# Patient Record
Sex: Male | Born: 1965 | Race: White | Hispanic: No | Marital: Married | Smoking: Former smoker
Health system: Southern US, Community
[De-identification: ages and names within clinical notes are randomized; demographics above are authoritative.]

---

## 2008-06-29 ENCOUNTER — Ambulatory Visit: Payer: Self-pay | Admitting: Occupational Medicine

## 2008-06-29 DIAGNOSIS — F329 Major depressive disorder, single episode, unspecified: Secondary | ICD-10-CM

## 2008-06-29 DIAGNOSIS — F3289 Other specified depressive episodes: Secondary | ICD-10-CM | POA: Insufficient documentation

## 2008-06-29 DIAGNOSIS — F411 Generalized anxiety disorder: Secondary | ICD-10-CM | POA: Insufficient documentation

## 2008-07-27 ENCOUNTER — Ambulatory Visit: Payer: Self-pay | Admitting: Occupational Medicine

## 2008-08-01 ENCOUNTER — Ambulatory Visit: Payer: Self-pay | Admitting: Family Medicine

## 2008-08-01 DIAGNOSIS — M76899 Other specified enthesopathies of unspecified lower limb, excluding foot: Secondary | ICD-10-CM | POA: Insufficient documentation

## 2008-08-01 DIAGNOSIS — S339XXA Sprain of unspecified parts of lumbar spine and pelvis, initial encounter: Secondary | ICD-10-CM | POA: Insufficient documentation

## 2008-08-01 DIAGNOSIS — S335XXA Sprain of ligaments of lumbar spine, initial encounter: Secondary | ICD-10-CM

## 2008-09-07 ENCOUNTER — Ambulatory Visit: Payer: Self-pay | Admitting: Family Medicine

## 2008-09-07 DIAGNOSIS — M545 Low back pain, unspecified: Secondary | ICD-10-CM | POA: Insufficient documentation

## 2008-10-02 ENCOUNTER — Encounter: Payer: Self-pay | Admitting: Family Medicine

## 2008-10-05 ENCOUNTER — Ambulatory Visit: Payer: Self-pay | Admitting: Family Medicine

## 2008-10-05 DIAGNOSIS — G510 Bell's palsy: Secondary | ICD-10-CM | POA: Insufficient documentation

## 2008-10-05 DIAGNOSIS — I10 Essential (primary) hypertension: Secondary | ICD-10-CM | POA: Insufficient documentation

## 2008-10-25 ENCOUNTER — Telehealth: Payer: Self-pay | Admitting: Family Medicine

## 2008-10-28 ENCOUNTER — Telehealth: Payer: Self-pay | Admitting: Family Medicine

## 2008-11-08 ENCOUNTER — Encounter: Payer: Self-pay | Admitting: Family Medicine

## 2008-11-08 ENCOUNTER — Ambulatory Visit: Payer: Self-pay | Admitting: Family Medicine

## 2008-11-09 ENCOUNTER — Encounter: Payer: Self-pay | Admitting: Family Medicine

## 2008-11-09 LAB — CONVERTED CEMR LAB
AST: 9 units/L (ref 0–37)
Alkaline Phosphatase: 62 units/L (ref 39–117)
CO2: 24 meq/L (ref 19–32)
Chloride: 105 meq/L (ref 96–112)
Creatinine, Ser: 0.75 mg/dL (ref 0.40–1.50)
Glucose, Bld: 160 mg/dL — ABNORMAL HIGH (ref 70–99)
Potassium: 4.6 meq/L (ref 3.5–5.3)
T3, Free: 3 pg/mL (ref 2.3–4.2)
TSH: 0.308 microintl units/mL — ABNORMAL LOW (ref 0.350–4.500)
Total Bilirubin: 0.7 mg/dL (ref 0.3–1.2)

## 2008-11-17 ENCOUNTER — Telehealth (INDEPENDENT_AMBULATORY_CARE_PROVIDER_SITE_OTHER): Payer: Self-pay | Admitting: *Deleted

## 2008-11-18 ENCOUNTER — Telehealth (INDEPENDENT_AMBULATORY_CARE_PROVIDER_SITE_OTHER): Payer: Self-pay | Admitting: *Deleted

## 2008-11-22 ENCOUNTER — Encounter: Payer: Self-pay | Admitting: Family Medicine

## 2008-11-23 ENCOUNTER — Telehealth (INDEPENDENT_AMBULATORY_CARE_PROVIDER_SITE_OTHER): Payer: Self-pay | Admitting: *Deleted

## 2008-11-23 DIAGNOSIS — R946 Abnormal results of thyroid function studies: Secondary | ICD-10-CM | POA: Insufficient documentation

## 2008-11-26 ENCOUNTER — Encounter: Payer: Self-pay | Admitting: Family Medicine

## 2008-11-30 ENCOUNTER — Encounter: Payer: Self-pay | Admitting: Family Medicine

## 2008-12-22 ENCOUNTER — Encounter: Payer: Self-pay | Admitting: Family Medicine

## 2008-12-28 ENCOUNTER — Encounter: Payer: Self-pay | Admitting: Family Medicine

## 2009-01-14 ENCOUNTER — Telehealth: Payer: Self-pay | Admitting: Family Medicine

## 2009-01-20 ENCOUNTER — Telehealth: Payer: Self-pay | Admitting: Family Medicine

## 2009-02-09 ENCOUNTER — Encounter: Payer: Self-pay | Admitting: Family Medicine

## 2009-02-11 ENCOUNTER — Encounter: Payer: Self-pay | Admitting: Family Medicine

## 2009-02-12 ENCOUNTER — Encounter: Payer: Self-pay | Admitting: Family Medicine

## 2009-02-16 ENCOUNTER — Encounter: Payer: Self-pay | Admitting: Family Medicine

## 2009-02-17 ENCOUNTER — Ambulatory Visit: Payer: Self-pay | Admitting: Family Medicine

## 2009-02-17 DIAGNOSIS — J019 Acute sinusitis, unspecified: Secondary | ICD-10-CM | POA: Insufficient documentation

## 2009-02-23 ENCOUNTER — Ambulatory Visit: Payer: Self-pay | Admitting: Family Medicine

## 2009-02-23 ENCOUNTER — Encounter: Admission: RE | Admit: 2009-02-23 | Discharge: 2009-02-23 | Payer: Self-pay | Admitting: Family Medicine

## 2009-02-23 DIAGNOSIS — R05 Cough: Secondary | ICD-10-CM

## 2009-02-23 DIAGNOSIS — R059 Cough, unspecified: Secondary | ICD-10-CM | POA: Insufficient documentation

## 2009-03-16 ENCOUNTER — Ambulatory Visit: Payer: Self-pay | Admitting: Family Medicine

## 2009-03-23 ENCOUNTER — Ambulatory Visit: Payer: Self-pay | Admitting: Family Medicine

## 2009-04-04 ENCOUNTER — Ambulatory Visit: Payer: Self-pay | Admitting: Family Medicine

## 2009-04-04 DIAGNOSIS — R51 Headache: Secondary | ICD-10-CM | POA: Insufficient documentation

## 2009-04-04 DIAGNOSIS — R519 Headache, unspecified: Secondary | ICD-10-CM | POA: Insufficient documentation

## 2009-04-05 ENCOUNTER — Encounter: Payer: Self-pay | Admitting: Family Medicine

## 2009-04-05 LAB — CONVERTED CEMR LAB
AST: 10 units/L (ref 0–37)
Alkaline Phosphatase: 61 units/L (ref 39–117)
Calcium: 10 mg/dL (ref 8.4–10.5)
HCT: 44.8 % (ref 39.0–52.0)
MCHC: 33.3 g/dL (ref 30.0–36.0)
MCV: 97.4 fL (ref 78.0–100.0)
Platelets: 333 10*3/uL (ref 150–400)
RDW: 15.1 % (ref 11.5–15.5)
Total Protein: 6.9 g/dL (ref 6.0–8.3)
Triglycerides: 427 mg/dL — ABNORMAL HIGH (ref <150)

## 2009-04-18 ENCOUNTER — Encounter: Payer: Self-pay | Admitting: Family Medicine

## 2009-04-19 ENCOUNTER — Ambulatory Visit: Payer: Self-pay | Admitting: Family Medicine

## 2009-04-19 DIAGNOSIS — E785 Hyperlipidemia, unspecified: Secondary | ICD-10-CM | POA: Insufficient documentation

## 2009-04-20 ENCOUNTER — Encounter: Payer: Self-pay | Admitting: Family Medicine

## 2009-04-20 LAB — CONVERTED CEMR LAB
AST: 10 units/L (ref 0–37)
CO2: 26 meq/L (ref 19–32)
Chloride: 102 meq/L (ref 96–112)
Glucose, Bld: 111 mg/dL — ABNORMAL HIGH (ref 70–99)
Potassium: 4.4 meq/L (ref 3.5–5.3)
Total Bilirubin: 0.5 mg/dL (ref 0.3–1.2)
Total Protein: 6.8 g/dL (ref 6.0–8.3)

## 2009-05-05 ENCOUNTER — Ambulatory Visit: Payer: Self-pay | Admitting: Family Medicine

## 2009-05-05 DIAGNOSIS — E119 Type 2 diabetes mellitus without complications: Secondary | ICD-10-CM | POA: Insufficient documentation

## 2009-05-05 LAB — CONVERTED CEMR LAB: Microalbumin U total vol: 150 mg/L

## 2009-05-11 ENCOUNTER — Telehealth (INDEPENDENT_AMBULATORY_CARE_PROVIDER_SITE_OTHER): Payer: Self-pay | Admitting: *Deleted

## 2009-05-15 ENCOUNTER — Telehealth: Payer: Self-pay | Admitting: Family Medicine

## 2009-05-17 ENCOUNTER — Ambulatory Visit: Payer: Self-pay | Admitting: Family Medicine

## 2009-05-17 ENCOUNTER — Telehealth: Payer: Self-pay | Admitting: Family Medicine

## 2009-05-17 DIAGNOSIS — M542 Cervicalgia: Secondary | ICD-10-CM | POA: Insufficient documentation

## 2009-05-18 ENCOUNTER — Encounter: Payer: Self-pay | Admitting: Family Medicine

## 2009-05-20 ENCOUNTER — Ambulatory Visit: Payer: Self-pay | Admitting: Family Medicine

## 2009-05-26 ENCOUNTER — Encounter: Payer: Self-pay | Admitting: Family Medicine

## 2009-06-06 ENCOUNTER — Ambulatory Visit: Payer: Self-pay | Admitting: Family Medicine

## 2009-06-13 ENCOUNTER — Telehealth: Payer: Self-pay | Admitting: Family Medicine

## 2009-06-22 ENCOUNTER — Ambulatory Visit: Payer: Self-pay | Admitting: Family Medicine

## 2009-06-24 ENCOUNTER — Encounter: Payer: Self-pay | Admitting: Family Medicine

## 2009-06-29 ENCOUNTER — Encounter: Payer: Self-pay | Admitting: Family Medicine

## 2009-07-12 ENCOUNTER — Telehealth: Payer: Self-pay | Admitting: Family Medicine

## 2009-08-10 ENCOUNTER — Telehealth: Payer: Self-pay | Admitting: Family Medicine

## 2009-08-31 ENCOUNTER — Telehealth: Payer: Self-pay | Admitting: Family Medicine

## 2009-09-06 ENCOUNTER — Emergency Department (HOSPITAL_COMMUNITY): Admission: EM | Admit: 2009-09-06 | Discharge: 2009-09-06 | Payer: Self-pay | Admitting: Emergency Medicine

## 2009-09-29 ENCOUNTER — Ambulatory Visit: Payer: Self-pay | Admitting: Family Medicine

## 2009-12-02 ENCOUNTER — Encounter: Payer: Self-pay | Admitting: Family Medicine

## 2009-12-05 ENCOUNTER — Encounter: Payer: Self-pay | Admitting: Family Medicine

## 2009-12-09 ENCOUNTER — Telehealth (INDEPENDENT_AMBULATORY_CARE_PROVIDER_SITE_OTHER): Payer: Self-pay | Admitting: *Deleted

## 2010-08-28 ENCOUNTER — Encounter: Payer: Self-pay | Admitting: Family Medicine

## 2010-09-05 NOTE — Progress Notes (Signed)
Summary: Alprazolam refills  Phone Note Refill Request Message from:  Pharmacy on Dec 09, 2009 2:08 PM  Refills Requested: Medication #1:  ALPRAZOLAM 1 MG TABS Take 1-2 tablet by mouth two times a day as needed anxiety Initial call taken by: Payton Spark CMA,  Dec 09, 2009 2:08 PM  Follow-up for Phone Call        Denies too soon. Current rx should last him at most 2 min 2 months.  Not due until 5/31 Follow-up by: Nani Gasser MD,  Dec 09, 2009 2:36 PM  Additional Follow-up for Phone Call Additional follow up Details #1::        [facility] informing Pt  Additional Follow-up by: Payton Spark CMA,  Dec 09, 2009 2:44 PM

## 2010-09-05 NOTE — Progress Notes (Signed)
Summary: Wants prednisone rx  Phone Note Call from Patient Call back at Home Phone [phone]   Caller: Patient Call For: Nani Gasser MD Summary of Call: Pt called and wants a refillon his prednisone 20mg  taking one tablet by mouth twice a day Initial call taken by: Kathlene November,  August 10, 2009 4:26 PM  Follow-up for Phone Call        Who rx this for him before? Neuro?  Follow-up by: Nani Gasser MD,  August 11, 2009 8:03 AM  Additional Follow-up for Phone Call Additional follow up Details #1::        He said Dr. Loleta Chance Additional Follow-up by: Kathlene November,  August 11, 2009 8:11 AM    Additional Follow-up for Phone Call Additional follow up Details #2::    Will refill x 1 but shuldl already be seeing new neuro. Have pharm send over dosing, instructions, and quantity. Follow-up by: Nani Gasser MD,  August 11, 2009 8:23 AM  Additional Follow-up for Phone Call Additional follow up Details #3:: Details for Additional Follow-up Action Taken: pt notified of above instructions. Additional Follow-up by: Kathlene November,  August 11, 2009 8:26 AM

## 2010-09-05 NOTE — Progress Notes (Signed)
Summary: need meds called in  Phone Note Call from Patient   Caller: Spouse Call For: [facility] Summary of Call: Dr.Jaelani Posa     Call Back  [phone]  Patients wife(Beverly) left a message on the machine and said Tymir need some meds called in--She didnt say what meds. Initial call taken by: Vanessa Swaziland,  August 31, 2009 8:08 AM  Follow-up for Phone Call        [facility] 1%. [facility]    Prescriptions: CLOTRIMAZOLE 1 % CREA (CLOTRIMAZOLE) Apply two times a day to affected area.  #1 lg tube x 1   Entered by:   Kathlene November   Authorized by:   Nani Gasser MD   Signed by:   Kathlene November on 08/31/2009   Method used:   [facility]  [address] [id number]       [address]       [place], [place]  [address]       Ph: [phone] or [phone]       Fax: [phone]   RxID:   [id number]
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
## 2010-09-05 NOTE — Consult Note (Signed)
Summary: [facility]  [facility]   Imported By: Lanelle Bal 12/12/2009 08:14:03  _____________________________________________________________________  External Attachment:    Type:   Image     Comment:   External Document

## 2010-09-05 NOTE — Letter (Signed)
Summary: Letter to Patient Regarding Lab Results/[facility]  Letter to Patient Regarding Lab Results/[facility]   Imported By: Lanelle Bal 12/12/2009 08:36:36  _____________________________________________________________________  External Attachment:    Type:   Image     Comment:   External Document

## 2010-09-05 NOTE — Progress Notes (Signed)
Summary: Chantix  Phone Note Call from Patient Call back at Home Phone [phone]   Caller: Patient Call For: Nani Gasser MD Summary of Call: Pt seen heart doctor and he reccomened he start Chantix to stop smoking. [facility] [address] Initial call taken by: Kathlene November,  August 31, 2009 8:24 AM  Follow-up for Phone Call        OK will call in but needs to fu in 3 weeks before next refill to see how he is doing.  Follow-up by: Nani Gasser MD,  August 31, 2009 8:50 AM    New/Updated Medications: CHANTIX STARTING MONTH PAK 0.5 MG X 11 & 1 MG X 42 TABS (VARENICLINE TARTRATE) Take as directed. Stop smoking after the first week of medicine. Prescriptions: CHANTIX STARTING MONTH PAK 0.5 MG X 11 & 1 MG X 42 TABS (VARENICLINE TARTRATE) Take as directed. Stop smoking after the first week of medicine.  #1 pack x 0   Entered by:   Kathlene November   Authorized by:   Nani Gasser MD   Signed by:   Kathlene November on 08/31/2009   Method used:   [facility]        [facility]  [address] [id number]       [address]       [place], [place]  [address]       Ph: [phone] or [phone]       Fax: [phone]   RxID:   [id number]

## 2010-10-26 LAB — URINALYSIS, ROUTINE W REFLEX MICROSCOPIC
Bilirubin Urine: NEGATIVE
Specific Gravity, Urine: 1.022 (ref 1.005–1.030)
Urobilinogen, UA: 0.2 mg/dL (ref 0.0–1.0)
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
## 2013-11-16 ENCOUNTER — Ambulatory Visit: Payer: Self-pay | Admitting: Physician Assistant

## 2013-11-16 DIAGNOSIS — Z0289 Encounter for other administrative examinations: Secondary | ICD-10-CM

## 2015-04-12 ENCOUNTER — Encounter (HOSPITAL_COMMUNITY): Payer: Self-pay | Admitting: Psychiatry

## 2015-04-12 ENCOUNTER — Ambulatory Visit (INDEPENDENT_AMBULATORY_CARE_PROVIDER_SITE_OTHER): Payer: 59 | Admitting: Psychiatry

## 2015-04-12 VITALS — BP 128/70 | HR 78 | Ht 72.0 in | Wt 225.0 lb

## 2015-04-12 DIAGNOSIS — F411 Generalized anxiety disorder: Secondary | ICD-10-CM

## 2015-04-12 DIAGNOSIS — F0630 Mood disorder due to known physiological condition, unspecified: Secondary | ICD-10-CM

## 2015-04-12 DIAGNOSIS — F331 Major depressive disorder, recurrent, moderate: Secondary | ICD-10-CM | POA: Diagnosis not present

## 2015-04-12 MED ORDER — TRAZODONE HCL 100 MG PO TABS: 100.0000 mg | ORAL_TABLET | Freq: Every evening | ORAL | Status: DC | PRN

## 2015-04-12 MED ORDER — BUSPIRONE HCL 7.5 MG PO TABS: 7.5000 mg | ORAL_TABLET | Freq: Every day | ORAL | Status: DC | PRN
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
## 2015-05-03 ENCOUNTER — Other Ambulatory Visit (HOSPITAL_COMMUNITY): Payer: Self-pay | Admitting: Psychiatry

## 2015-05-04 ENCOUNTER — Telehealth (HOSPITAL_COMMUNITY): Payer: Self-pay

## 2015-05-05 ENCOUNTER — Ambulatory Visit (INDEPENDENT_AMBULATORY_CARE_PROVIDER_SITE_OTHER): Payer: 59 | Admitting: Psychiatry

## 2015-05-05 ENCOUNTER — Encounter (HOSPITAL_COMMUNITY): Payer: Self-pay | Admitting: Psychiatry

## 2015-05-05 DIAGNOSIS — F0630 Mood disorder due to known physiological condition, unspecified: Secondary | ICD-10-CM

## 2015-05-05 DIAGNOSIS — F331 Major depressive disorder, recurrent, moderate: Secondary | ICD-10-CM

## 2015-05-05 DIAGNOSIS — F411 Generalized anxiety disorder: Secondary | ICD-10-CM

## 2015-05-05 MED ORDER — BUPROPION HCL ER (SR) 150 MG PO TB12: 150.0000 mg | ORAL_TABLET | Freq: Two times a day (BID) | ORAL | Status: DC

## 2015-05-05 MED ORDER — QUETIAPINE FUMARATE 100 MG PO TABS: 100.0000 mg | ORAL_TABLET | Freq: Every day | ORAL | Status: DC

## 2015-05-05 MED ORDER — BUSPIRONE HCL 10 MG PO TABS: 10.0000 mg | ORAL_TABLET | Freq: Two times a day (BID) | ORAL | Status: DC

## 2015-05-05 MED ORDER — TRAZODONE HCL 100 MG PO TABS: 100.0000 mg | ORAL_TABLET | Freq: Every evening | ORAL | Status: DC | PRN

## 2015-05-05 NOTE — Telephone Encounter (Signed)
Seen today and prescriptions given for 30days as buspirone dose was changed .
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
## 2015-05-31 ENCOUNTER — Other Ambulatory Visit (HOSPITAL_COMMUNITY): Payer: Self-pay | Admitting: Psychiatry

## 2015-06-02 ENCOUNTER — Other Ambulatory Visit (HOSPITAL_COMMUNITY): Payer: Self-pay | Admitting: Psychiatry

## 2015-06-02 NOTE — Telephone Encounter (Signed)
[facility] Trazodone 100mg , Buspar 10mg . Per Dr. Gilmore LarocheAkhtar, medication request is denied. Pt has requested medication too early. Pt has a f/u appt on 06/03/15.

## 2015-06-03 ENCOUNTER — Ambulatory Visit (INDEPENDENT_AMBULATORY_CARE_PROVIDER_SITE_OTHER): Payer: 59 | Admitting: Psychiatry

## 2015-06-03 ENCOUNTER — Encounter (HOSPITAL_COMMUNITY): Payer: Self-pay | Admitting: Psychiatry

## 2015-06-03 VITALS — BP 128/64 | HR 76 | Ht 72.0 in | Wt 223.0 lb

## 2015-06-03 DIAGNOSIS — F331 Major depressive disorder, recurrent, moderate: Secondary | ICD-10-CM

## 2015-06-03 DIAGNOSIS — F411 Generalized anxiety disorder: Secondary | ICD-10-CM | POA: Diagnosis not present

## 2015-06-03 DIAGNOSIS — F0630 Mood disorder due to known physiological condition, unspecified: Secondary | ICD-10-CM

## 2015-06-03 DIAGNOSIS — G4700 Insomnia, unspecified: Secondary | ICD-10-CM | POA: Diagnosis not present

## 2015-06-03 MED ORDER — BUPROPION HCL ER (SR) 150 MG PO TB12: 150.0000 mg | ORAL_TABLET | Freq: Two times a day (BID) | ORAL | Status: DC

## 2015-06-03 MED ORDER — DULOXETINE HCL 60 MG PO CPEP: 60.0000 mg | ORAL_CAPSULE | Freq: Every day | ORAL | Status: DC

## 2015-06-03 MED ORDER — TRAZODONE HCL 100 MG PO TABS: 100.0000 mg | ORAL_TABLET | Freq: Every evening | ORAL | Status: DC | PRN

## 2015-06-03 MED ORDER — BUSPIRONE HCL 10 MG PO TABS: 10.0000 mg | ORAL_TABLET | Freq: Two times a day (BID) | ORAL | Status: DC

## 2015-06-03 MED ORDER — QUETIAPINE FUMARATE 100 MG PO TABS: 100.0000 mg | ORAL_TABLET | Freq: Every day | ORAL | Status: DC
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
## 2015-06-22 ENCOUNTER — Other Ambulatory Visit (HOSPITAL_COMMUNITY): Payer: Self-pay | Admitting: Psychiatry

## 2015-06-28 NOTE — Telephone Encounter (Signed)
Received medication requests from [facility] for Trazodone 100mg , Buspar 10mg , Seroquel 100mg , and Wellbutrin 150mg . Per Dr. Gilmore LarocheAkhtar, medication requests are denied. Pt received refills for medication on 06/03/15 with 1 additional refills. Pt has a f/u appt on 08/04/15.

## 2015-08-03 ENCOUNTER — Other Ambulatory Visit (HOSPITAL_COMMUNITY): Payer: Self-pay | Admitting: Psychiatry

## 2015-08-04 ENCOUNTER — Ambulatory Visit (HOSPITAL_COMMUNITY): Payer: 59 | Admitting: Psychiatry

## 2015-08-04 NOTE — Telephone Encounter (Signed)
Received medication request from [facility] for Seroquel 100mg . Per Dr. Gilmore LarocheAkhtar, pt is authorized for a refill for Seroquel 100mg , #30. Rx was sent to pharmacy. Pt has a f/u appt on 08/16/15. Called and informed pt of prescription status. Pt verbalizes understanding.

## 2015-08-09 ENCOUNTER — Telehealth (HOSPITAL_COMMUNITY): Payer: Self-pay | Admitting: *Deleted

## 2015-08-09 MED ORDER — BUSPIRONE HCL 10 MG PO TABS: 10.0000 mg | ORAL_TABLET | Freq: Two times a day (BID) | ORAL | Status: DC

## 2015-08-09 NOTE — Telephone Encounter (Signed)
Pt called for a refill for Buspar 10mg . Per Dr. Gilmore LarocheAkhtar, pt is authorized for a refill for Buspar 10mg , #60. Rx was sent to pharmacy. Pt has a f/u appt on 08/1015. Called and informed pt of Rx status. Pt verbalizes understanding.

## 2015-08-16 ENCOUNTER — Encounter (HOSPITAL_COMMUNITY): Payer: Self-pay | Admitting: Psychiatry

## 2015-08-16 ENCOUNTER — Ambulatory Visit (INDEPENDENT_AMBULATORY_CARE_PROVIDER_SITE_OTHER): Payer: 59 | Admitting: Psychiatry

## 2015-08-16 VITALS — BP 128/74 | HR 84 | Ht 72.0 in | Wt 233.6 lb

## 2015-08-16 DIAGNOSIS — F411 Generalized anxiety disorder: Secondary | ICD-10-CM

## 2015-08-16 DIAGNOSIS — G4700 Insomnia, unspecified: Secondary | ICD-10-CM | POA: Diagnosis not present

## 2015-08-16 DIAGNOSIS — F0630 Mood disorder due to known physiological condition, unspecified: Secondary | ICD-10-CM

## 2015-08-16 MED ORDER — QUETIAPINE FUMARATE 100 MG PO TABS: 100.0000 mg | ORAL_TABLET | Freq: Every day | ORAL | Status: DC

## 2015-08-16 MED ORDER — TRAZODONE HCL 100 MG PO TABS: 100.0000 mg | ORAL_TABLET | Freq: Every evening | ORAL | Status: DC | PRN

## 2015-08-16 MED ORDER — BUSPIRONE HCL 10 MG PO TABS: 10.0000 mg | ORAL_TABLET | Freq: Two times a day (BID) | ORAL | Status: DC

## 2015-08-16 MED ORDER — BUPROPION HCL ER (SR) 150 MG PO TB12: 150.0000 mg | ORAL_TABLET | Freq: Two times a day (BID) | ORAL | Status: DC

## 2015-08-16 MED ORDER — DULOXETINE HCL 60 MG PO CPEP: 60.0000 mg | ORAL_CAPSULE | Freq: Every day | ORAL | Status: DC
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
## 2015-10-14 ENCOUNTER — Ambulatory Visit (HOSPITAL_COMMUNITY): Payer: Self-pay | Admitting: Psychiatry

## 2015-10-25 ENCOUNTER — Encounter (HOSPITAL_COMMUNITY): Payer: Self-pay | Admitting: Psychiatry

## 2015-10-25 ENCOUNTER — Ambulatory Visit (INDEPENDENT_AMBULATORY_CARE_PROVIDER_SITE_OTHER): Payer: 59 | Admitting: Psychiatry

## 2015-10-25 VITALS — BP 128/74 | HR 76 | Ht 72.0 in | Wt 232.0 lb

## 2015-10-25 DIAGNOSIS — F0630 Mood disorder due to known physiological condition, unspecified: Secondary | ICD-10-CM

## 2015-10-25 DIAGNOSIS — F331 Major depressive disorder, recurrent, moderate: Secondary | ICD-10-CM | POA: Diagnosis not present

## 2015-10-25 DIAGNOSIS — F411 Generalized anxiety disorder: Secondary | ICD-10-CM

## 2015-10-25 DIAGNOSIS — G4700 Insomnia, unspecified: Secondary | ICD-10-CM | POA: Diagnosis not present

## 2015-10-25 MED ORDER — BUSPIRONE HCL 10 MG PO TABS: 10.0000 mg | ORAL_TABLET | Freq: Two times a day (BID) | ORAL | Status: DC

## 2015-10-25 MED ORDER — BUPROPION HCL ER (SR) 150 MG PO TB12: 150.0000 mg | ORAL_TABLET | Freq: Two times a day (BID) | ORAL | Status: DC

## 2015-10-25 MED ORDER — DULOXETINE HCL 60 MG PO CPEP: 60.0000 mg | ORAL_CAPSULE | Freq: Every day | ORAL | Status: DC

## 2015-10-25 MED ORDER — TRAZODONE HCL 100 MG PO TABS: 100.0000 mg | ORAL_TABLET | Freq: Every evening | ORAL | Status: DC | PRN

## 2015-10-25 MED ORDER — QUETIAPINE FUMARATE 100 MG PO TABS: 100.0000 mg | ORAL_TABLET | Freq: Every day | ORAL | Status: DC
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
## 2016-01-17 ENCOUNTER — Ambulatory Visit (HOSPITAL_COMMUNITY): Payer: Self-pay | Admitting: Psychiatry

## 2016-02-05 ENCOUNTER — Other Ambulatory Visit (HOSPITAL_COMMUNITY): Payer: Self-pay | Admitting: Psychiatry

## 2016-02-07 ENCOUNTER — Telehealth (HOSPITAL_COMMUNITY): Payer: Self-pay | Admitting: Psychiatry

## 2016-02-10 ENCOUNTER — Ambulatory Visit (HOSPITAL_COMMUNITY): Payer: Self-pay | Admitting: Psychiatry

## 2016-02-13 ENCOUNTER — Encounter (HOSPITAL_COMMUNITY): Payer: Self-pay | Admitting: Psychiatry

## 2016-02-13 ENCOUNTER — Ambulatory Visit (INDEPENDENT_AMBULATORY_CARE_PROVIDER_SITE_OTHER): Payer: 59 | Admitting: Psychiatry

## 2016-02-13 DIAGNOSIS — G4700 Insomnia, unspecified: Secondary | ICD-10-CM

## 2016-02-13 DIAGNOSIS — F411 Generalized anxiety disorder: Secondary | ICD-10-CM

## 2016-02-13 DIAGNOSIS — F0630 Mood disorder due to known physiological condition, unspecified: Secondary | ICD-10-CM

## 2016-02-13 MED ORDER — TRAZODONE HCL 100 MG PO TABS: 100.0000 mg | ORAL_TABLET | Freq: Every evening | ORAL | Status: AC | PRN

## 2016-02-13 MED ORDER — BUSPIRONE HCL 10 MG PO TABS: 10.0000 mg | ORAL_TABLET | Freq: Two times a day (BID) | ORAL | Status: AC

## 2016-02-13 MED ORDER — DULOXETINE HCL 60 MG PO CPEP: 60.0000 mg | ORAL_CAPSULE | Freq: Every day | ORAL | Status: AC

## 2016-02-13 MED ORDER — BUPROPION HCL ER (SR) 150 MG PO TB12: 150.0000 mg | ORAL_TABLET | Freq: Two times a day (BID) | ORAL | Status: AC

## 2016-02-13 MED ORDER — QUETIAPINE FUMARATE 100 MG PO TABS: 100.0000 mg | ORAL_TABLET | Freq: Every day | ORAL | Status: AC
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
## 2016-02-16 NOTE — Telephone Encounter (Signed)
Received fax from [facility] requesting a refill for Quetiapine Fumarate. Pt was seen in office on 02/13/16.  Medication was escribed to pharmacy on 02/13/16 with 2 refills. Pt f/u appt w/ provider is 05/08/2016.

## 2016-05-08 ENCOUNTER — Ambulatory Visit (HOSPITAL_COMMUNITY): Payer: Self-pay | Admitting: Psychiatry

## 2016-06-27 ENCOUNTER — Other Ambulatory Visit (HOSPITAL_COMMUNITY): Payer: Self-pay | Admitting: Psychiatry

## 2016-06-27 NOTE — Telephone Encounter (Signed)
Received medication request from [facility]  For Buspar. Per Dr. Gilmore LarocheAkhtar, refill request is denied. Pt will need to schedule an apt. Pt was last seen 02/2016, no show last apt in Oct. lvm for pt to contact for an apt.

## 2016-10-16 ENCOUNTER — Telehealth (HOSPITAL_COMMUNITY): Payer: Self-pay | Admitting: *Deleted

## 2016-10-16 NOTE — Telephone Encounter (Signed)
Received fax from [facility] requesting a refill for Buspirone. Per Dr. Gilmore LarocheAkhtar, refill request is denied. Pt was last seen July 2017. Pt will need to schedule an apt with office.

## 2020-09-06 DEATH — deceased
